# Patient Record
Sex: Female | Born: 1955 | Race: White | Hispanic: No | Smoking: Current every day smoker
Health system: Southern US, Community
[De-identification: ages and names within clinical notes are randomized; demographics above are authoritative.]

## PROBLEM LIST (undated history)

## (undated) DIAGNOSIS — I499 Cardiac arrhythmia, unspecified: Secondary | ICD-10-CM

## (undated) DIAGNOSIS — I8390 Asymptomatic varicose veins of unspecified lower extremity: Secondary | ICD-10-CM

## (undated) HISTORY — DX: Asymptomatic varicose veins of unspecified lower extremity: I83.90

## (undated) HISTORY — DX: Cardiac arrhythmia, unspecified: I49.9

## (undated) HISTORY — PX: TONSILECTOMY, ADENOIDECTOMY, BILATERAL MYRINGOTOMY AND TUBES: SHX2538

---

## 1999-04-29 ENCOUNTER — Other Ambulatory Visit: Admission: RE | Admit: 1999-04-29 | Discharge: 1999-04-29 | Payer: Self-pay | Admitting: *Deleted

## 2002-02-14 ENCOUNTER — Other Ambulatory Visit: Admission: RE | Admit: 2002-02-14 | Discharge: 2002-02-14 | Payer: Self-pay | Admitting: *Deleted

## 2002-03-23 ENCOUNTER — Encounter: Payer: Self-pay | Admitting: *Deleted

## 2002-03-23 ENCOUNTER — Encounter: Admission: RE | Admit: 2002-03-23 | Discharge: 2002-03-23 | Payer: Self-pay | Admitting: *Deleted

## 2003-04-04 ENCOUNTER — Other Ambulatory Visit: Admission: RE | Admit: 2003-04-04 | Discharge: 2003-04-04 | Payer: Self-pay | Admitting: Obstetrics and Gynecology

## 2004-09-03 ENCOUNTER — Other Ambulatory Visit: Admission: RE | Admit: 2004-09-03 | Discharge: 2004-09-03 | Payer: Self-pay | Admitting: *Deleted

## 2006-12-15 ENCOUNTER — Encounter: Admission: RE | Admit: 2006-12-15 | Discharge: 2006-12-15 | Payer: Self-pay | Admitting: Family Medicine

## 2006-12-29 ENCOUNTER — Encounter: Admission: RE | Admit: 2006-12-29 | Discharge: 2006-12-29 | Payer: Self-pay | Admitting: Family Medicine

## 2008-06-16 IMAGING — US US SOFT TISSUE HEAD/NECK
1 series · 14 of 25 positions shown · non-contrast
Comparison: none

CLINICAL DATA: Difficulty swallowing.  Lump in throat. 
 THYROID ULTRASOUND:
TECHNIQUE: Ultrasound examination of the thyroid gland and adjacent soft tissue structures was performed.

[Series 1: unknown · 0.08mm/px · 14 of 62 slices shown]
[im 1/62]
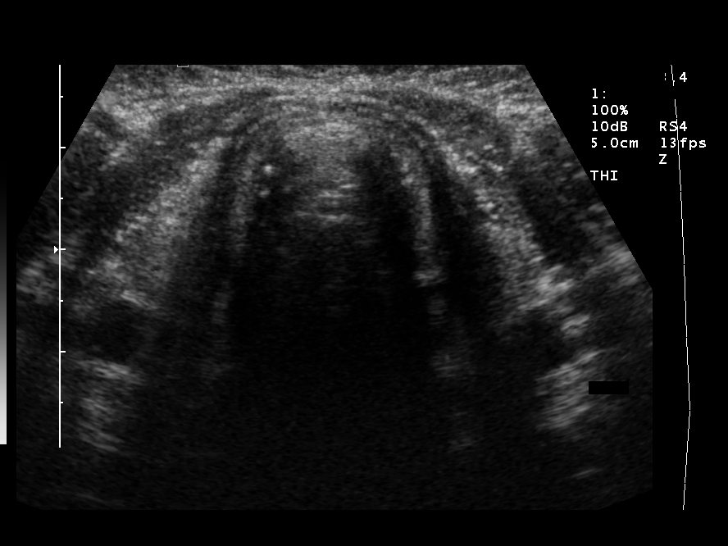
[im 6/62]
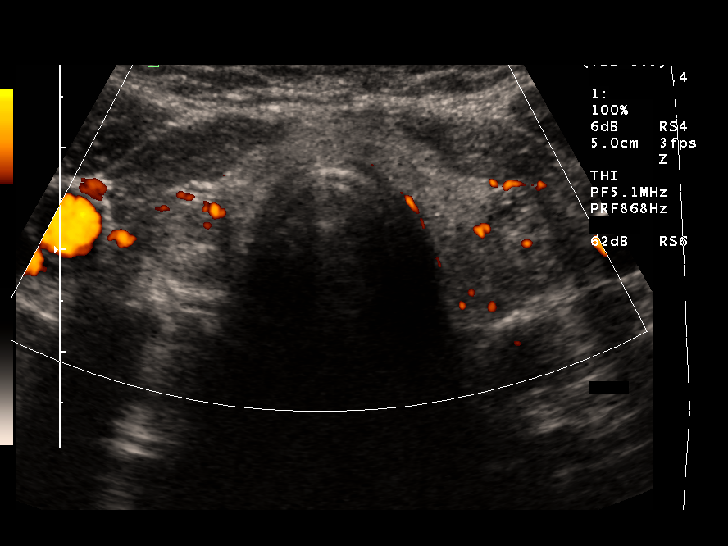
[im 11/62]
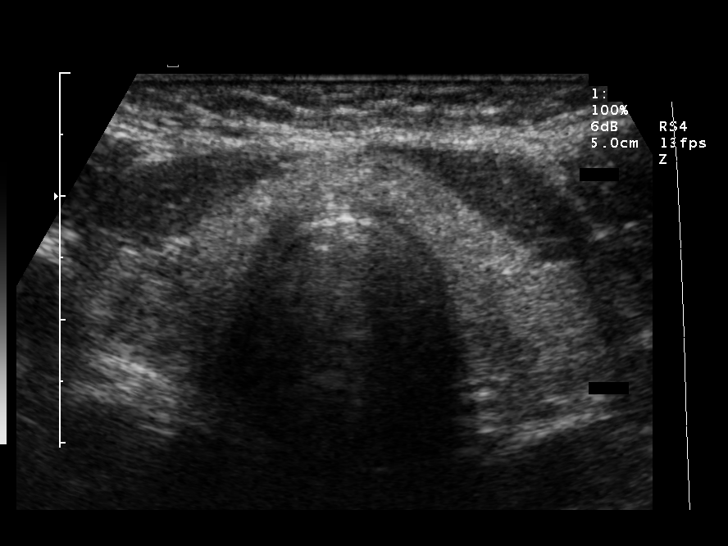
[im 16/62]
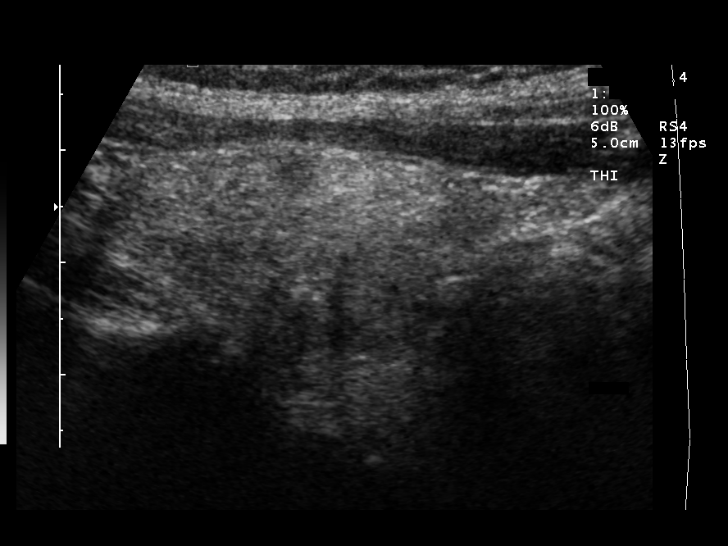
[im 21/62]
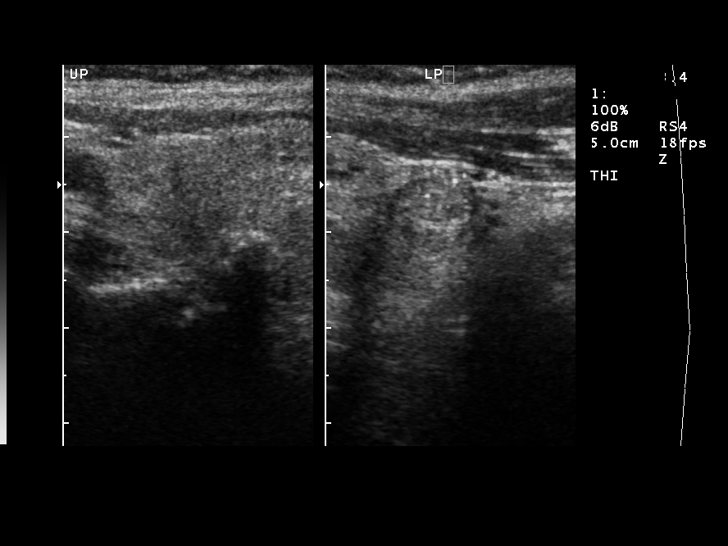
[im 23/62]
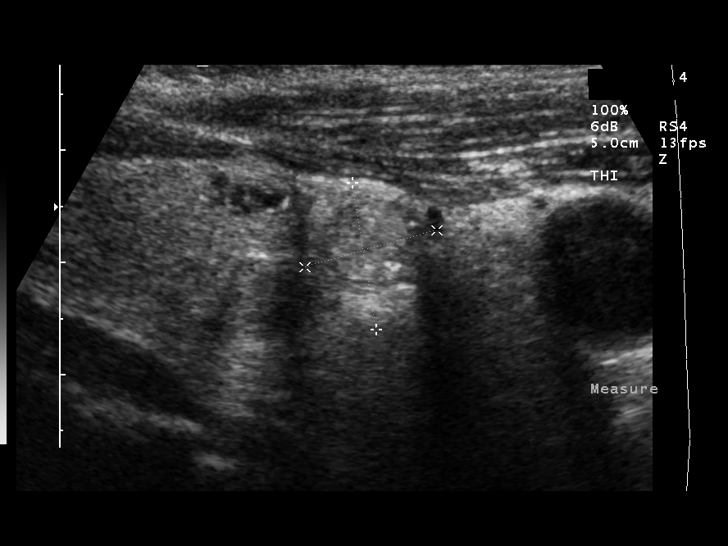
[im 28/62]
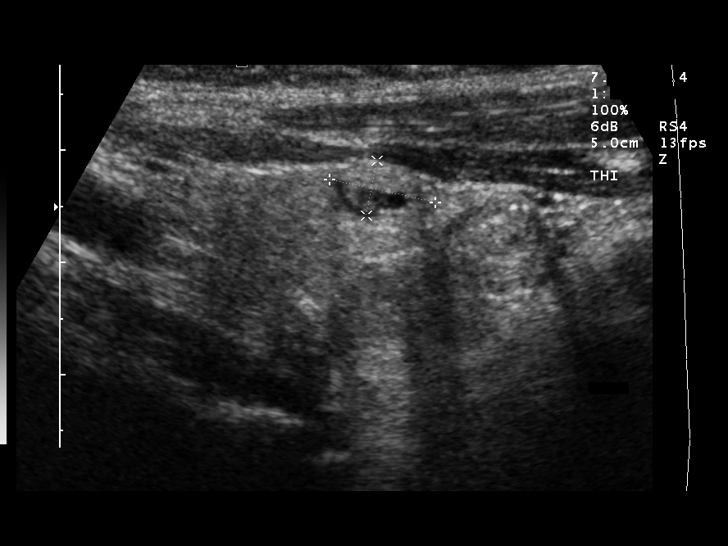
[im 34/62]
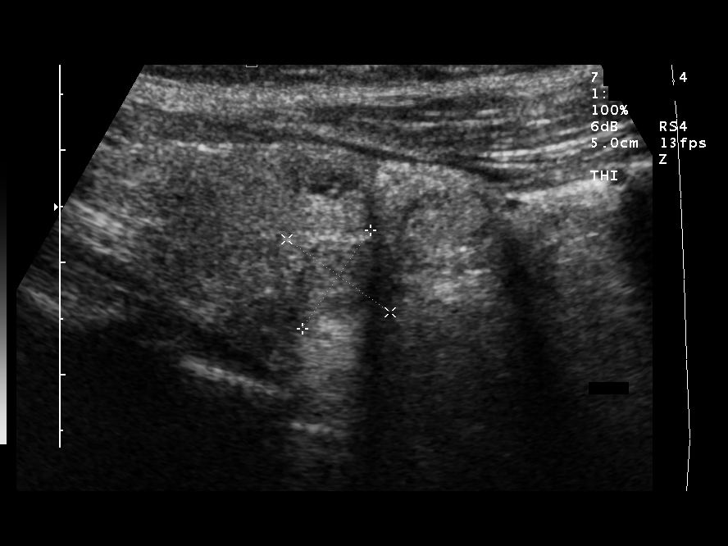
[im 39/62]
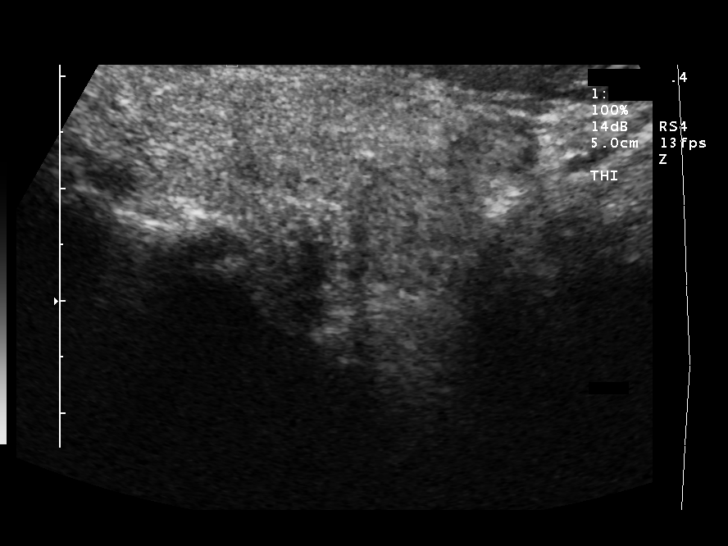
[im 41/62]
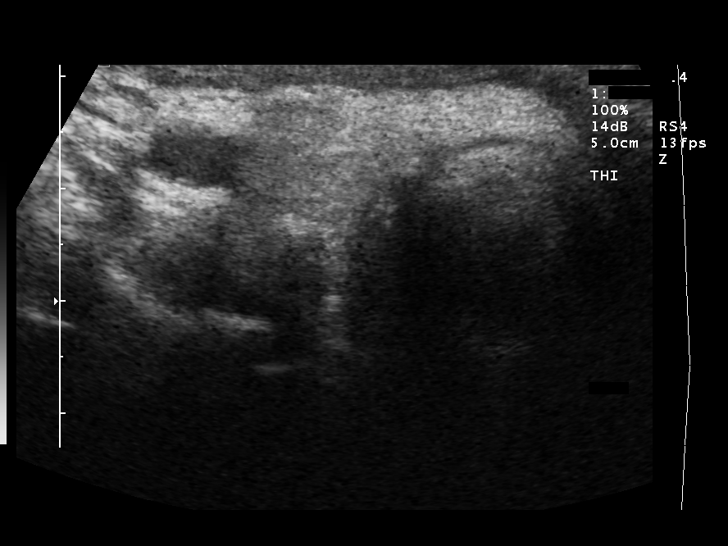
[im 46/62]
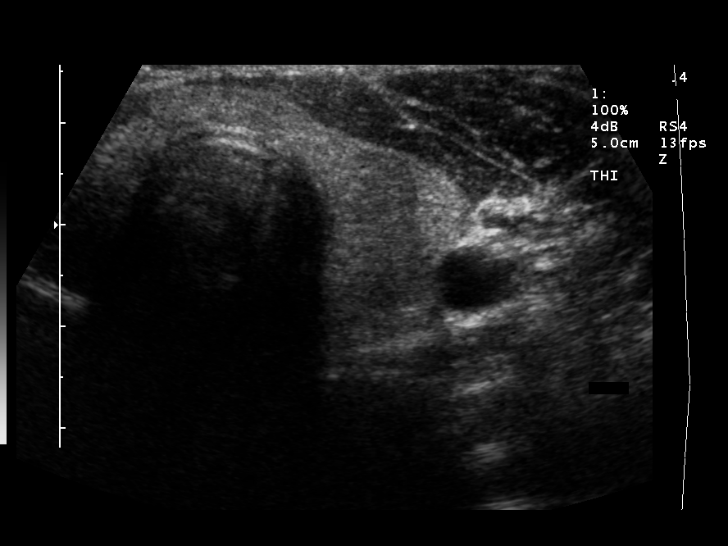
[im 51/62]
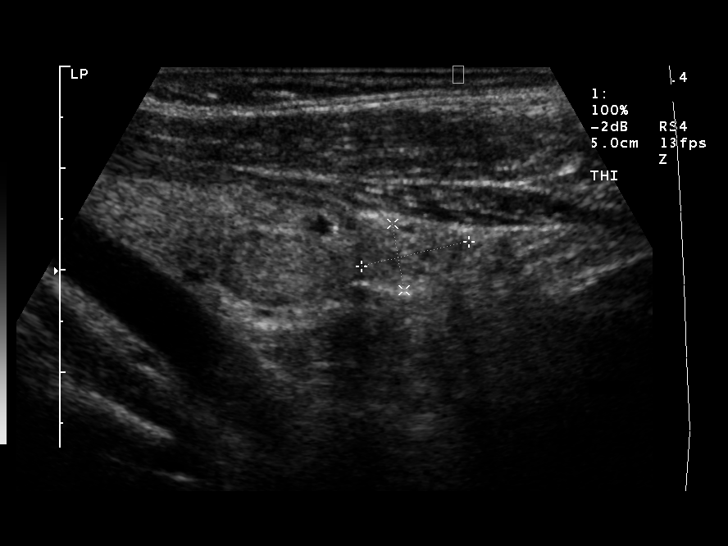
[im 56/62]
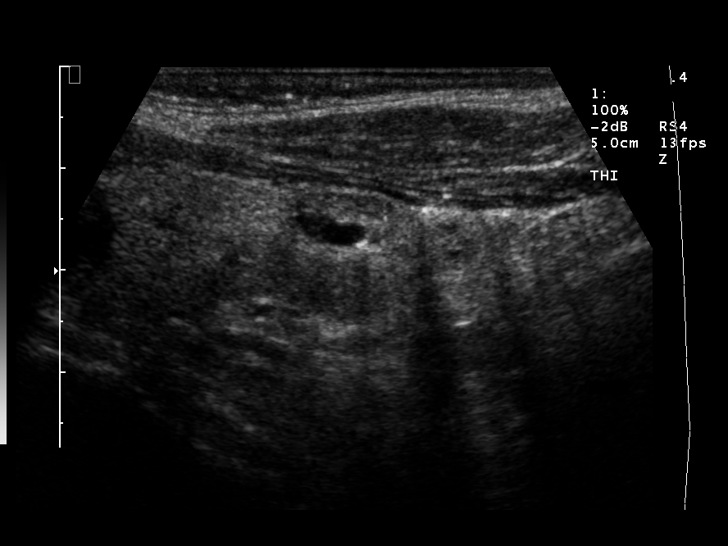
[im 62/62]
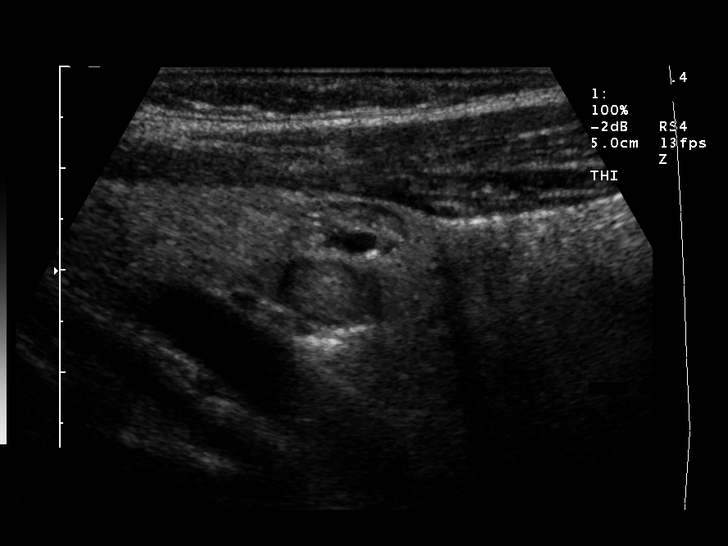

[14 of 25 positions shown; findings below may reference images not displayed]

FINDINGS: The thyroid gland is minimally prominent in size but very inhomogeneous.  The right lobe measures 4.8 cm sagittally with a depth of 2.3 cm and width of 1.6 cm.  The left lobe measures 4.7 x 1.5 x 1.5 cm with the isthmus measuring 5 mm.  There are several solid nodules bilaterally.  The largest is in the lower pole on the left of 1.6 x 1.3 x 1.4 cm.  Several other nodules are present primarily in the right lobe of 13 mm or less in size.
IMPRESSION: Multiple solid nodules the largest of 1.6 cm in the lower pole on the left.  The gland is within normal limits in size but inhomogeneous in echogenicity.

## 2012-12-09 ENCOUNTER — Encounter: Payer: Self-pay | Admitting: Vascular Surgery

## 2013-01-05 ENCOUNTER — Encounter: Payer: Self-pay | Admitting: Vascular Surgery

## 2013-01-06 ENCOUNTER — Encounter: Payer: Self-pay | Admitting: Vascular Surgery

## 2013-01-12 ENCOUNTER — Encounter: Payer: Self-pay | Admitting: Vascular Surgery

## 2013-01-13 ENCOUNTER — Ambulatory Visit (INDEPENDENT_AMBULATORY_CARE_PROVIDER_SITE_OTHER): Payer: BC Managed Care – PPO | Admitting: Vascular Surgery

## 2013-01-13 ENCOUNTER — Encounter: Payer: Self-pay | Admitting: Vascular Surgery

## 2013-01-13 ENCOUNTER — Encounter (INDEPENDENT_AMBULATORY_CARE_PROVIDER_SITE_OTHER): Payer: BC Managed Care – PPO | Admitting: *Deleted

## 2013-01-13 VITALS — BP 100/71 | HR 52 | Resp 16 | Ht 66.0 in | Wt 192.0 lb

## 2013-01-13 DIAGNOSIS — I83893 Varicose veins of bilateral lower extremities with other complications: Secondary | ICD-10-CM

## 2013-01-13 NOTE — Progress Notes (Signed)
[facility] [place] HISTORY AND PHYSICAL   History of Present Illness:  Patient is a 57 y.o. year old female who presents for evaluation of chronic leg swelling and varicose veins. She denies family history of hypercoagulable state. She denies prior history of DVT. She denies prior significant trauma to her lower extremities. She currently works as a Runner, broadcasting/film/video at [employer]. She is standing on her legs large amounts of time during the day. She does wear knee-high compression stockings. She states that her left leg swells more than the right but this becomes worse as the day progresses. She also describes heaviness aching and fullness in her legs. She also describes an itching sensation deep within her legs every evening. This is relieved primarily by elevation and rest. She does have family history of varicose veins. Other medical problems include prior history of irregular heartbeat but no recent symptoms.  Past Medical History  Diagnosis Date  . Irregular heart beat   . Varicose veins     Past Surgical History  Procedure Laterality Date  . Tonsilectomy, adenoidectomy, bilateral myringotomy and tubes       Social History History  Substance Use Topics  . Smoking status: Current Every Day Smoker -- 0.25 packs/day    Types: Cigarettes  . Smokeless tobacco: Never Used  . Alcohol Use: 0.5 oz/week    1 drink(s) per week    Family History Family History  Problem Relation Age of Onset  . Diabetes Mother   . Deep vein thrombosis Father   . Alzheimer's disease Father   . Hypertension Father   . Heart disease Father   . Heart attack Father     Allergies  Not on File   Current Outpatient Prescriptions  Medication Sig Dispense Refill  . amphetamine-dextroamphetamine (ADDERALL) 20 MG tablet Take 20 mg by mouth 2 (two) times daily.      Marland Kitchen co-enzyme Q-10 30 MG capsule Take 30 mg by mouth 3 (three) times daily.      . Multiple Vitamin  (MULTIVITAMIN) tablet Take 1 tablet by mouth daily.      . multivitamin-lutein (OCUVITE-LUTEIN) CAPS Take 1 capsule by mouth daily.       No current facility-administered medications for this visit.    ROS:   General:  No weight loss, Fever, chills  HEENT: No recent headaches, no nasal bleeding, no visual changes, no sore throat  Neurologic: No dizziness, blackouts, seizures. No recent symptoms of stroke or mini- stroke. No recent episodes of slurred speech, or temporary blindness.  Cardiac: No recent episodes of chest pain/pressure, no shortness of breath at rest.  No shortness of breath with exertion.    Vascular: No history of rest pain in feet.  No history of claudication.  No history of non-healing ulcer, No history of DVT   Pulmonary: No home oxygen, no productive cough, no hemoptysis,  No asthma or wheezing  Musculoskeletal:  [ ]  Arthritis, [ ]  Low back pain,  [x ] Joint pain  Hematologic:No history of hypercoagulable state.  No history of easy bleeding.  No history of anemia  Gastrointestinal: No hematochezia or melena,  No gastroesophageal reflux, no trouble swallowing  Urinary: [ ]  chronic Kidney disease, [ ]  on HD - [ ]  MWF or [ ]  TTHS, [ ]  Burning with urination, [ ]  Frequent urination, [ ]  Difficulty urinating;   Skin: No rashes  Psychological: No history of anxiety,  No history of depression   Physical Examination  Filed  Vitals:   01/13/13 1010  BP: 100/71  Pulse: 52  Resp: 16  Height: 5\' 6"  (1.676 m)  Weight: 192 lb (87.091 kg)  SpO2: 97%    Body mass index is 31 kg/(m^2).  General:  Alert and oriented, no acute distress HEENT: Normal Neck: No bruit or JVD Pulmonary: Clear to auscultation bilaterally Cardiac: Regular Rate and Rhythm without murmur Abdomen: Soft, non-tender, non-distended, no mass, slightly obese Skin: No rash, scattered 2-3 mm clusters of varicosities in the left posterior calf. Few blush spider-type varicosities in the anterior  thigh bilaterally. Extremity Pulses:  2+ radial, brachial, femoral, dorsalis pedis, posterior tibial pulses bilaterally Musculoskeletal: No deformity trace edema  Neurologic: Upper and lower extremity motor 5/5 and symmetric  DATA: Patient had a venous duplex exam today. This showed trivial reflux in the right and left greater saphenous veins mid thigh. She did however have evidence of deep vein incompetence in the common femoral vein bilaterally   ASSESSMENT: Bilateral deep venous incompetence with some contribution from the superficial system. However I do not believe the superficial venous incompetence is severe enough to consider ablation. I did discuss with her the pathophysiology of venous incompetence. She will continue to wear her compression stockings and replace these as needed. She is fairly compliant with her compression therapy. She also did talk to our pain clinic nurse today about some possible sclerotherapy of the spider-type varicosities and reticular is. She will call to schedule this in the future if she wishes to proceed.  Plan: See above  Fabienne Bruns, MD [facility] [place] Office: [phone] Pager: [phone]

## 2013-11-17 ENCOUNTER — Other Ambulatory Visit (HOSPITAL_COMMUNITY)
Admission: RE | Admit: 2013-11-17 | Discharge: 2013-11-17 | Disposition: A | Discharge status: Home / Self Care | Payer: BC Managed Care – PPO | Source: Ambulatory Visit | Attending: Family Medicine | Admitting: Family Medicine

## 2013-11-17 ENCOUNTER — Other Ambulatory Visit: Payer: Self-pay | Admitting: Family Medicine

## 2013-11-17 DIAGNOSIS — Z124 Encounter for screening for malignant neoplasm of cervix: Secondary | ICD-10-CM | POA: Insufficient documentation

## 2014-07-25 ENCOUNTER — Encounter: Payer: Self-pay | Admitting: *Deleted

## 2014-07-26 ENCOUNTER — Ambulatory Visit (INDEPENDENT_AMBULATORY_CARE_PROVIDER_SITE_OTHER): Payer: BC Managed Care – PPO | Admitting: *Deleted

## 2014-07-26 DIAGNOSIS — I780 Hereditary hemorrhagic telangiectasia: Secondary | ICD-10-CM

## 2014-07-26 DIAGNOSIS — I781 Nevus, non-neoplastic: Secondary | ICD-10-CM

## 2014-07-26 NOTE — Progress Notes (Signed)
.  3% Sotradecol administered with a 27g butterfly.  Patient received a total of 5cc.  Pt has some reflux so suggested just trying one syringe to get most noticeable spiders and retics. Hoping for good results. Will follow prn.  Photos: Yes.    Compression stockings applied: Yes.

## 2014-07-27 ENCOUNTER — Encounter: Payer: Self-pay | Admitting: Vascular Surgery

## 2019-06-23 ENCOUNTER — Other Ambulatory Visit (HOSPITAL_COMMUNITY)
Admission: RE | Admit: 2019-06-23 | Discharge: 2019-06-23 | Disposition: A | Discharge status: Home / Self Care | Payer: BC Managed Care – PPO | Source: Ambulatory Visit | Attending: Family Medicine | Admitting: Family Medicine

## 2019-06-23 ENCOUNTER — Other Ambulatory Visit: Payer: Self-pay | Admitting: Family Medicine

## 2019-06-23 DIAGNOSIS — Z124 Encounter for screening for malignant neoplasm of cervix: Secondary | ICD-10-CM | POA: Insufficient documentation

## 2019-06-27 LAB — CYTOLOGY - PAP
Diagnosis: NEGATIVE
HPV: NOT DETECTED

## 2019-06-28 ENCOUNTER — Other Ambulatory Visit: Payer: Self-pay | Admitting: Family Medicine

## 2019-06-28 DIAGNOSIS — E2839 Other primary ovarian failure: Secondary | ICD-10-CM

## 2019-06-28 DIAGNOSIS — Z1231 Encounter for screening mammogram for malignant neoplasm of breast: Secondary | ICD-10-CM

## 2019-07-12 ENCOUNTER — Other Ambulatory Visit: Payer: Self-pay

## 2019-07-12 DIAGNOSIS — Z20822 Contact with and (suspected) exposure to covid-19: Secondary | ICD-10-CM

## 2019-07-13 LAB — NOVEL CORONAVIRUS, NAA: SARS-CoV-2, NAA: NOT DETECTED

## 2019-11-05 ENCOUNTER — Ambulatory Visit: Payer: BC Managed Care – PPO | Attending: Internal Medicine

## 2019-11-05 DIAGNOSIS — Z23 Encounter for immunization: Secondary | ICD-10-CM

## 2019-11-05 NOTE — Progress Notes (Signed)
   Covid-19 Vaccination Clinic  Name:  Lauren Miller    MRN: [id number] DOB: 1956-03-31  11/05/2019  [name] was observed post Covid-19 immunization for 15 minutes without incidence. She was provided with Vaccine Information Sheet and instruction to access the V-Safe system.   [name] was instructed to call 911 with any severe reactions post vaccine: Marland Kitchen Difficulty breathing  . Swelling of your face and throat  . A fast heartbeat  . A bad rash all over your body  . Dizziness and weakness    Immunizations Administered    Name Date Dose VIS Date Route   Pfizer COVID-19 Vaccine 11/05/2019  3:00 PM 0.3 mL 09/23/2019 Intramuscular   Manufacturer: [employer], [employer]   Lot: [id number]   NDC: [address]-5

## 2019-11-27 ENCOUNTER — Ambulatory Visit: Payer: BC Managed Care – PPO | Attending: Internal Medicine

## 2019-11-27 DIAGNOSIS — Z23 Encounter for immunization: Secondary | ICD-10-CM | POA: Insufficient documentation

## 2019-11-27 NOTE — Progress Notes (Signed)
   Covid-19 Vaccination Clinic  Name:  Lauren Miller    MRN: [id number] DOB: [birth year]  11/27/2019  Lauren Miller was observed post Covid-19 immunization for 15 minutes without incidence. She was provided with Vaccine Information Sheet and instruction to access the V-Safe system.   Lauren Miller was instructed to call 911 with any severe reactions post vaccine: Marland Kitchen Difficulty breathing  . Swelling of your face and throat  . A fast heartbeat  . A bad rash all over your body  . Dizziness and weakness    Immunizations Administered    Name Date Dose VIS Date Route   Pfizer COVID-19 Vaccine 11/27/2019 12:23 PM 0.3 mL 09/23/2019 Intramuscular   Manufacturer: [employer], [employer]   Lot: [id number]   NDC: [address]-4

## 2020-01-02 ENCOUNTER — Other Ambulatory Visit (HOSPITAL_COMMUNITY): Payer: Self-pay | Admitting: Family Medicine

## 2020-02-23 ENCOUNTER — Other Ambulatory Visit: Payer: Self-pay | Admitting: Family Medicine

## 2020-02-23 DIAGNOSIS — Z1231 Encounter for screening mammogram for malignant neoplasm of breast: Secondary | ICD-10-CM

## 2020-03-01 ENCOUNTER — Ambulatory Visit
Admission: RE | Admit: 2020-03-01 | Discharge: 2020-03-01 | Disposition: A | Discharge status: Home / Self Care | Payer: BC Managed Care – PPO | Source: Ambulatory Visit

## 2020-03-01 ENCOUNTER — Other Ambulatory Visit: Payer: Self-pay

## 2020-03-01 DIAGNOSIS — Z1231 Encounter for screening mammogram for malignant neoplasm of breast: Secondary | ICD-10-CM

## 2020-10-16 MED FILL — ESTRADIOL 0.1 MG/GM CREA: 0.1 | 90 days supply | Qty: 43 | Fill #0

## 2021-01-01 ENCOUNTER — Other Ambulatory Visit (HOSPITAL_BASED_OUTPATIENT_CLINIC_OR_DEPARTMENT_OTHER): Payer: Self-pay

## 2021-09-01 IMAGING — MG DIGITAL SCREENING BILAT W/ TOMO W/ CAD
8 series · 8 of 24 positions shown · non-contrast
Comparison: None.

CLINICAL DATA: Screening.

EXAM:
DIGITAL SCREENING BILATERAL MAMMOGRAM WITH TOMO AND CAD

[R MLO synth-2D]
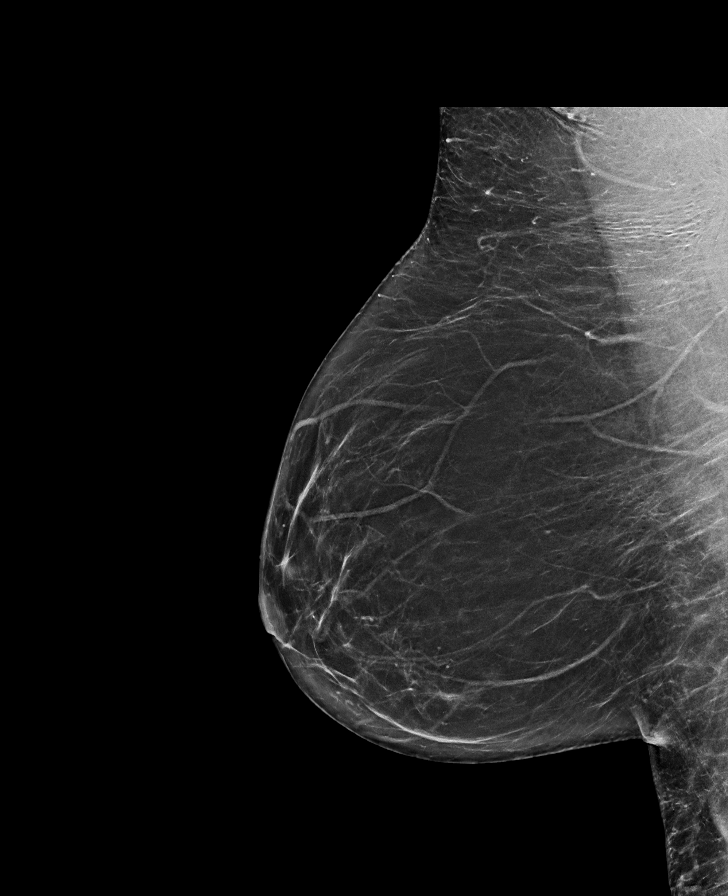

[R CC synth-2D]
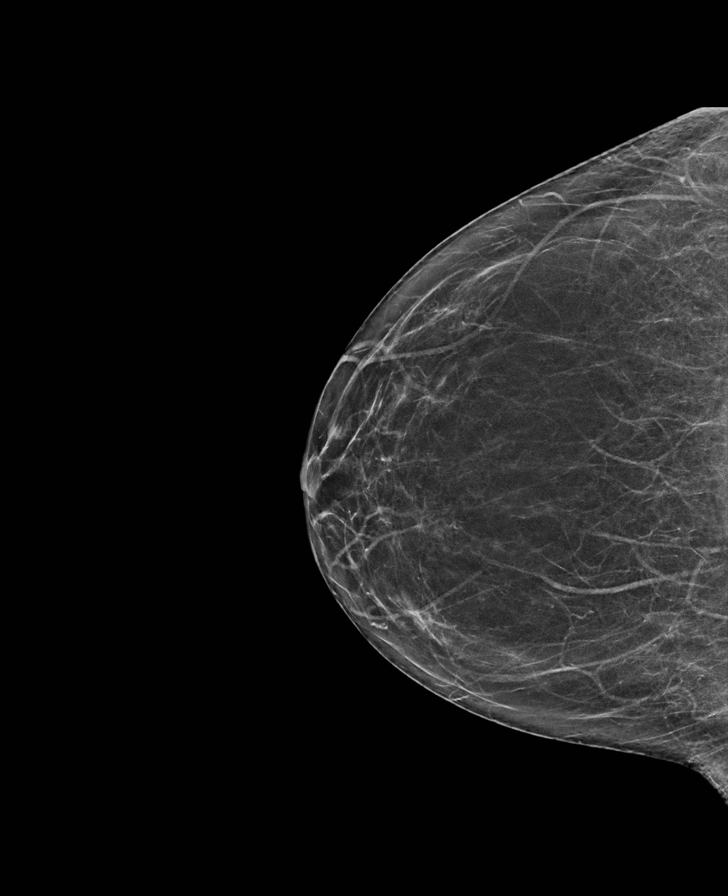

[L CC synth-2D]
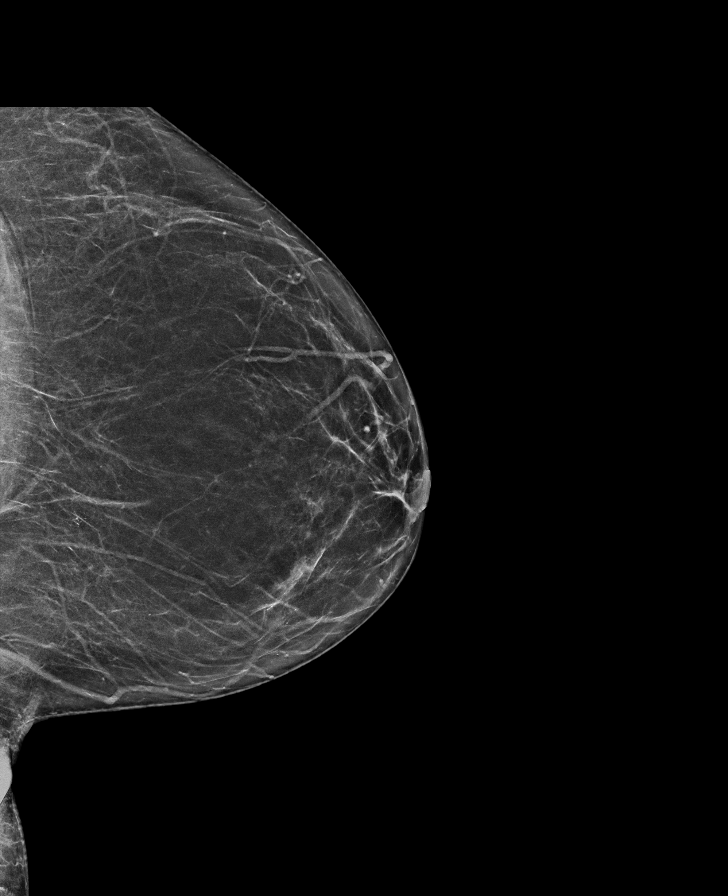

[L MLO synth-2D]
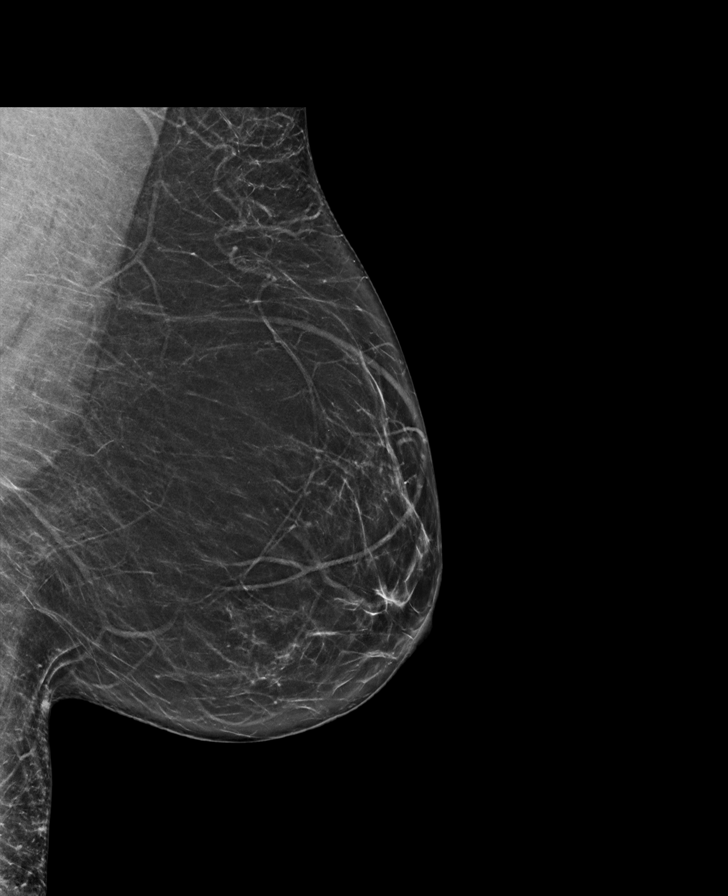

[L MLO tomo · tomo slice 35/69.0]
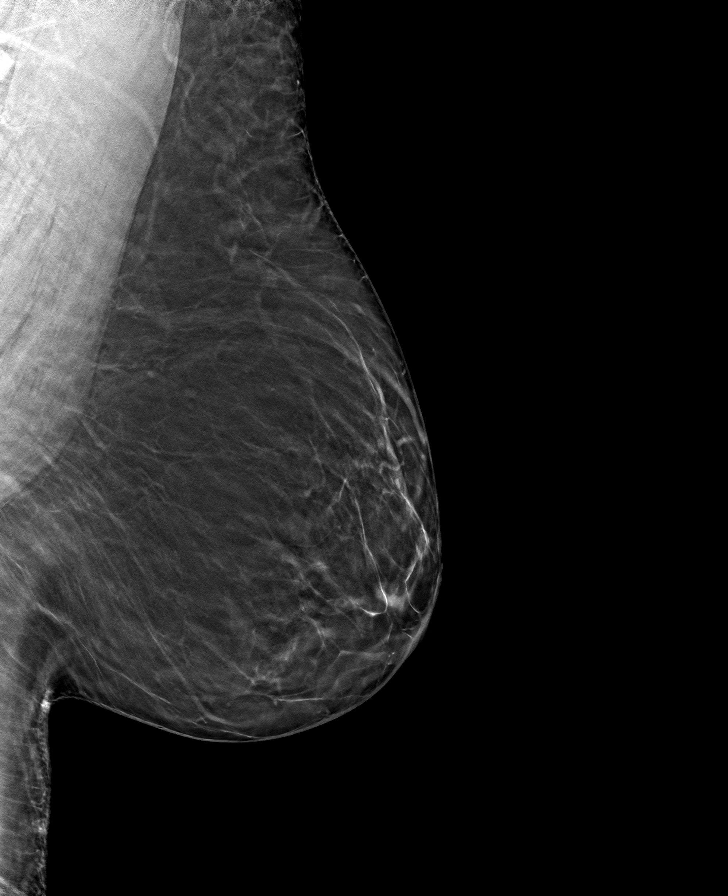

[R CC tomo · tomo slice 31/62.0]
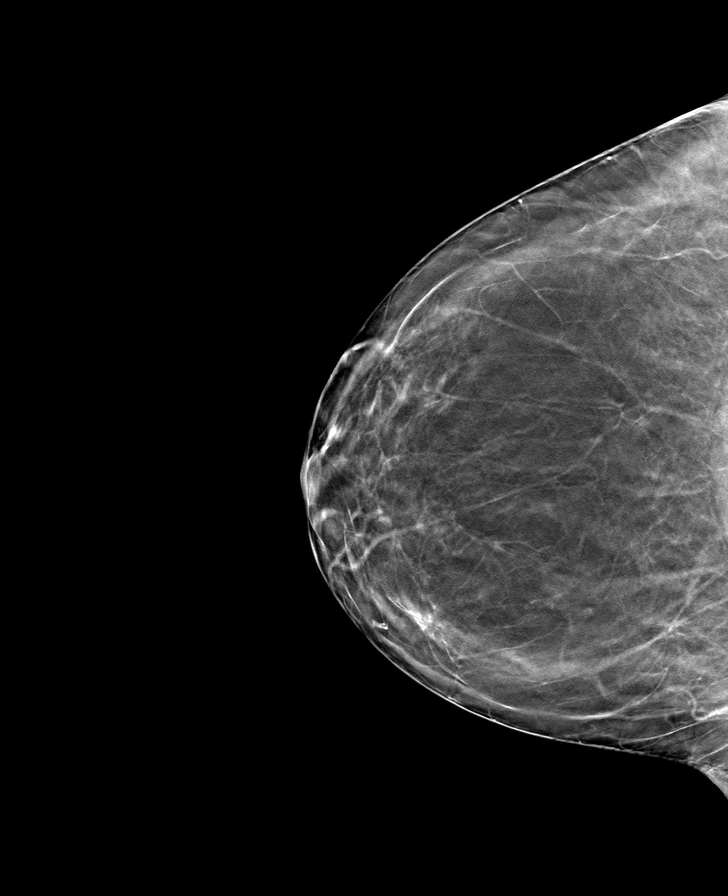

[L CC tomo · tomo slice 32/63.0]
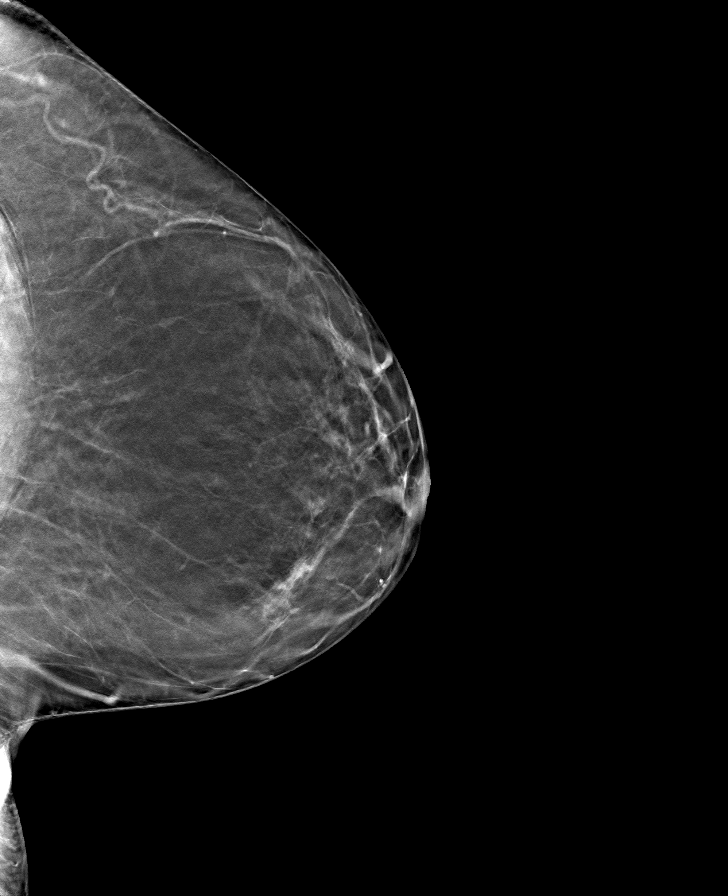

[R MLO tomo · tomo slice 37/72.0]
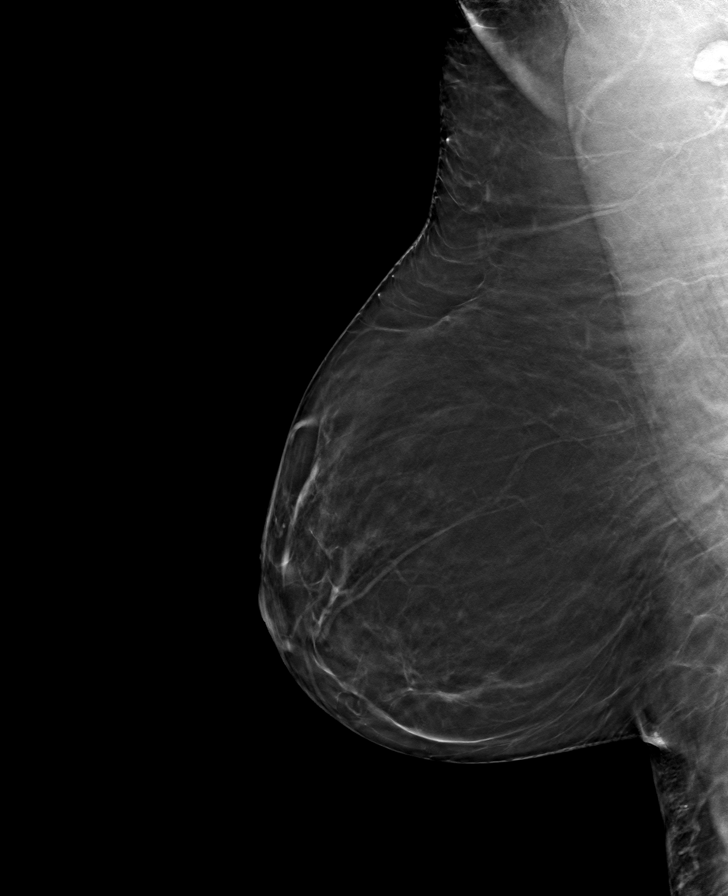

[8 of 24 positions shown; findings below may reference images not displayed]

ACR Breast Density Category b: There are scattered areas of
fibroglandular density.
FINDINGS: There are no findings suspicious for malignancy. Images were
processed with CAD.
IMPRESSION: No mammographic evidence of malignancy. A result letter of this
screening mammogram will be mailed directly to the patient.

RECOMMENDATION:
Screening mammogram in one year. (Code:Y5-G-EJ6)

BI-RADS CATEGORY  1: Negative.

## 2022-01-06 DIAGNOSIS — F900 Attention-deficit hyperactivity disorder, predominantly inattentive type: Secondary | ICD-10-CM | POA: Diagnosis not present

## 2022-05-31 ENCOUNTER — Ambulatory Visit
Admission: EM | Admit: 2022-05-31 | Discharge: 2022-05-31 | Disposition: A | Discharge status: Home / Self Care | Payer: Medicare PPO

## 2022-05-31 DIAGNOSIS — H60502 Unspecified acute noninfective otitis externa, left ear: Secondary | ICD-10-CM

## 2022-05-31 MED ORDER — CIPROFLOXACIN-DEXAMETHASONE 0.3-0.1 % OT SUSP: 4.0000 [drp] | Freq: Two times a day (BID) | OTIC | 0 refills | Status: AC

## 2022-05-31 NOTE — Discharge Instructions (Signed)
Take medication as prescribed. May take Tylenol or Ibuprofen pain, fever, or general discomfort. Warm compresses to the affected ear help with comfort. Do not stick anything inside the ear while symptoms persist. Avoid getting water inside of the ear while symptoms persist. Follow-up if symptoms do not improve.

## 2022-05-31 NOTE — ED Provider Notes (Signed)
[facility]    CSN: [id number] Arrival date & time: 05/31/22  0911      History   Chief Complaint Chief Complaint  Patient presents with   Otalgia    HPI Lauren Miller is a 66 y.o. female.   The history is provided by the patient.   Patient presents for complaints of left ear pain that been present for the past 4 days.  Patient states ear started feeling itchy, now she has decreased hearing and increased pain.  Patient states that she has been swimming frequently over the past several weeks.  She denies fever, chills, or upper respiratory symptoms.  Patient states she has been using alcohol in the left ear with minimal relief.  Past Medical History:  Diagnosis Date   Irregular heart beat    Varicose veins     Patient Active Problem List   Diagnosis Date Noted   Spider veins 07/26/2014   Varicose veins of lower extremities with other complications 01/13/2013    Past Surgical History:  Procedure Laterality Date   TONSILECTOMY, ADENOIDECTOMY, BILATERAL MYRINGOTOMY AND TUBES      OB History   No obstetric history on file.      Home Medications    Prior to Admission medications   Medication Sig Start Date End Date Taking? Authorizing Provider  ciprofloxacin-dexamethasone (CIPRODEX) OTIC suspension Place 4 drops into the left ear 2 (two) times daily for 7 days. 05/31/22 06/07/22 Yes Ludene Stokke-Warren, Sadie Haber, NP  Creatinine POWD by Does not apply route.   Yes [provider]  Emollient (COLLAGEN EX) Apply topically.   Yes [provider]  naproxen sodium (ALEVE) 220 MG tablet Take 220 mg by mouth.   Yes [provider]  amphetamine-dextroamphetamine (ADDERALL) 20 MG tablet Take 20 mg by mouth 2 (two) times daily.    [provider]  co-enzyme Q-10 30 MG capsule Take 30 mg by mouth 3 (three) times daily.    [provider]  estradiol (ESTRACE) 0.1 MG/GM vaginal cream Place 1 g vaginally 2 (two) times a week.  05/22/22   [provider]  Multiple Vitamin (MULTIVITAMIN) tablet Take 1 tablet by mouth daily.    [provider]  multivitamin-lutein (OCUVITE-LUTEIN) CAPS Take 1 capsule by mouth daily.    [provider]    Family History Family History  Problem Relation Age of Onset   Diabetes Mother    Deep vein thrombosis Father    Alzheimer's disease Father    Hypertension Father    Heart disease Father    Heart attack Father     Social History Social History   Tobacco Use   Smoking status: Every Day    Packs/day: 0.25    Types: Cigarettes   Smokeless tobacco: Never  Substance Use Topics   Alcohol use: Yes    Alcohol/week: 1.0 standard drink of alcohol    Types: 1 drink(s) per week   Drug use: Yes    Types: Marijuana     Allergies   Patient has no known allergies.   Review of Systems Review of Systems Per HPI  Physical Exam Triage Vital Signs ED Triage Vitals  Enc Vitals Group     BP 05/31/22 0918 123/79     Pulse Rate 05/31/22 0918 60     Resp 05/31/22 0918 16     Temp 05/31/22 0918 97.8 F (36.6 C)     Temp Source 05/31/22 0918 Oral     SpO2 05/31/22 0918 95 %  Weight --      Height --      Head Circumference --      Peak Flow --      Pain Score 05/31/22 0919 4     Pain Loc --      Pain Edu? --      Excl. in GC? --    No data found.  Updated Vital Signs BP 123/79 (BP Location: Right Arm)   Pulse 60   Temp 97.8 F (36.6 C) (Oral)   Resp 16   SpO2 95%   Visual Acuity Right Eye Distance:   Left Eye Distance:   Bilateral Distance:    Right Eye Near:   Left Eye Near:    Bilateral Near:     Physical Exam Vitals and nursing note reviewed.  Constitutional:      General: She is not in acute distress.    Appearance: Normal appearance.  HENT:     Head: Normocephalic.     Right Ear: Tympanic membrane, ear canal and external ear normal.     Left Ear: Tympanic membrane normal.     Ears:     Comments: Tragus and pinna  tender to palpation. Left ear canal is erythematous.    Nose: Nose normal.     Mouth/Throat:     Mouth: Mucous membranes are moist.  Eyes:     Extraocular Movements: Extraocular movements intact.     Pupils: Pupils are equal, round, and reactive to light.  Cardiovascular:     Rate and Rhythm: Normal rate and regular rhythm.     Pulses: Normal pulses.     Heart sounds: Normal heart sounds.  Pulmonary:     Effort: Pulmonary effort is normal.     Breath sounds: Normal breath sounds.  Abdominal:     General: Bowel sounds are normal.     Palpations: Abdomen is soft.  Musculoskeletal:     Cervical back: Normal range of motion.  Lymphadenopathy:     Cervical: No cervical adenopathy.  Skin:    General: Skin is warm and dry.  Neurological:     General: No focal deficit present.     Mental Status: She is alert and oriented to person, place, and time.  Psychiatric:        Mood and Affect: Mood normal.      UC Treatments / Results  Labs (all labs ordered are listed, but only abnormal results are displayed) Labs Reviewed - No data to display  EKG   Radiology No results found.  Procedures Procedures (including critical care time)  Medications Ordered in UC Medications - No data to display  Initial Impression / Assessment and Plan / UC Course  I have reviewed the triage vital signs and the nursing notes.  Pertinent labs & imaging results that were available during my care of the patient were reviewed by me and considered in my medical decision making (see chart for details).  Patient presents for complaints of left ear symptoms that been present for the past 4 days.  On exam, patient has tenderness with manipulation to the tragus and pinna of the left ear.  Ear canal is erythematous and swollen.  Patient reports that she has been swimming frequently over the past several weeks.  Symptoms are consistent with left otitis externa.  Supportive care recommendations were provided to  the patient.  Patient encouraged to follow-up in this clinic or with her primary care physician if symptoms fail to improve. Final Clinical Impressions(s) /  UC Diagnoses   Final diagnoses:  Acute otitis externa of left ear, unspecified type     Discharge Instructions      Take medication as prescribed. May take Tylenol or Ibuprofen pain, fever, or general discomfort. Warm compresses to the affected ear help with comfort. Do not stick anything inside the ear while symptoms persist. Avoid getting water inside of the ear while symptoms persist. Follow-up if symptoms do not improve.      ED Prescriptions     Medication Sig Dispense Auth. Provider   ciprofloxacin-dexamethasone (CIPRODEX) OTIC suspension Place 4 drops into the left ear 2 (two) times daily for 7 days. 7.5 mL Tiago Humphrey-Warren, Sadie Haber, NP      PDMP not reviewed this encounter.   Abran Cantor, NP 05/31/22 [phone]

## 2022-05-31 NOTE — ED Triage Notes (Signed)
Pt reports left ear pain, itching and hearing decrease x 4 days.

## 2022-06-20 DIAGNOSIS — H35363 Drusen (degenerative) of macula, bilateral: Secondary | ICD-10-CM | POA: Diagnosis not present

## 2022-06-20 DIAGNOSIS — H02885 Meibomian gland dysfunction left lower eyelid: Secondary | ICD-10-CM | POA: Diagnosis not present

## 2022-06-20 DIAGNOSIS — H04121 Dry eye syndrome of right lacrimal gland: Secondary | ICD-10-CM | POA: Diagnosis not present

## 2022-06-20 DIAGNOSIS — H02882 Meibomian gland dysfunction right lower eyelid: Secondary | ICD-10-CM | POA: Diagnosis not present

## 2022-06-23 DIAGNOSIS — H04123 Dry eye syndrome of bilateral lacrimal glands: Secondary | ICD-10-CM | POA: Diagnosis not present

## 2022-06-26 ENCOUNTER — Other Ambulatory Visit: Payer: Self-pay | Admitting: Family Medicine

## 2022-06-26 ENCOUNTER — Ambulatory Visit
Admission: RE | Admit: 2022-06-26 | Discharge: 2022-06-26 | Disposition: A | Discharge status: Home / Self Care | Payer: Medicare PPO | Source: Ambulatory Visit | Attending: Family Medicine | Admitting: Family Medicine

## 2022-06-26 DIAGNOSIS — M25571 Pain in right ankle and joints of right foot: Secondary | ICD-10-CM

## 2022-06-26 DIAGNOSIS — Z13820 Encounter for screening for osteoporosis: Secondary | ICD-10-CM | POA: Diagnosis not present

## 2022-06-26 DIAGNOSIS — Z1159 Encounter for screening for other viral diseases: Secondary | ICD-10-CM | POA: Diagnosis not present

## 2022-06-26 DIAGNOSIS — E7800 Pure hypercholesterolemia, unspecified: Secondary | ICD-10-CM | POA: Diagnosis not present

## 2022-06-26 DIAGNOSIS — Z23 Encounter for immunization: Secondary | ICD-10-CM | POA: Diagnosis not present

## 2022-06-26 DIAGNOSIS — N760 Acute vaginitis: Secondary | ICD-10-CM | POA: Diagnosis not present

## 2022-06-26 DIAGNOSIS — M7989 Other specified soft tissue disorders: Secondary | ICD-10-CM | POA: Diagnosis not present

## 2022-06-26 DIAGNOSIS — M19071 Primary osteoarthritis, right ankle and foot: Secondary | ICD-10-CM | POA: Diagnosis not present

## 2022-06-26 DIAGNOSIS — F900 Attention-deficit hyperactivity disorder, predominantly inattentive type: Secondary | ICD-10-CM | POA: Diagnosis not present

## 2022-06-26 DIAGNOSIS — Z1239 Encounter for other screening for malignant neoplasm of breast: Secondary | ICD-10-CM | POA: Diagnosis not present

## 2022-06-26 DIAGNOSIS — Z1231 Encounter for screening mammogram for malignant neoplasm of breast: Secondary | ICD-10-CM

## 2022-06-26 DIAGNOSIS — Z0000 Encounter for general adult medical examination without abnormal findings: Secondary | ICD-10-CM | POA: Diagnosis not present

## 2022-07-15 DIAGNOSIS — Z1211 Encounter for screening for malignant neoplasm of colon: Secondary | ICD-10-CM | POA: Diagnosis not present

## 2022-07-21 LAB — COLOGUARD: COLOGUARD: NEGATIVE

## 2022-08-19 ENCOUNTER — Ambulatory Visit
Admission: EM | Admit: 2022-08-19 | Discharge: 2022-08-19 | Disposition: A | Discharge status: Home / Self Care | Payer: Medicare PPO | Attending: Nurse Practitioner | Admitting: Nurse Practitioner

## 2022-08-19 ENCOUNTER — Encounter: Payer: Self-pay | Admitting: Emergency Medicine

## 2022-08-19 DIAGNOSIS — R300 Dysuria: Secondary | ICD-10-CM | POA: Diagnosis not present

## 2022-08-19 DIAGNOSIS — Z113 Encounter for screening for infections with a predominantly sexual mode of transmission: Secondary | ICD-10-CM | POA: Diagnosis not present

## 2022-08-19 DIAGNOSIS — Z114 Encounter for screening for human immunodeficiency virus [HIV]: Secondary | ICD-10-CM | POA: Insufficient documentation

## 2022-08-19 DIAGNOSIS — N760 Acute vaginitis: Secondary | ICD-10-CM | POA: Diagnosis not present

## 2022-08-19 LAB — POCT URINALYSIS DIP (MANUAL ENTRY)
Bilirubin, UA: NEGATIVE
Blood, UA: NEGATIVE
Glucose, UA: NEGATIVE mg/dL
Ketones, POC UA: NEGATIVE mg/dL
Nitrite, UA: NEGATIVE
Protein Ur, POC: NEGATIVE mg/dL
Spec Grav, UA: 1.02 (ref 1.010–1.025)
Urobilinogen, UA: 0.2 E.U./dL
pH, UA: 5.5 (ref 5.0–8.0)

## 2022-08-19 MED ORDER — NYSTATIN 100000 UNIT/GM EX POWD: 1.0000 | Freq: Three times a day (TID) | CUTANEOUS | 0 refills | Status: DC

## 2022-08-19 MED ORDER — FLUCONAZOLE 150 MG PO TABS: 150.0000 mg | ORAL_TABLET | Freq: Once | ORAL | 0 refills | Status: AC

## 2022-08-19 NOTE — ED Provider Notes (Signed)
[facility]    CSN: [id number] Arrival date & time: 08/19/22  1000      History   Chief Complaint No chief complaint on file.   HPI Lauren Miller is a 66 y.o. female.   Patient presents for 2 weeks of vaginal irritation, itching on the inside and outside.  She thinks that she has a yeast infection and has been self treating with Monistat, miconazole cream.  Reports are now sores around her anal area and there is open skin in between her anus and vagina that is very painful.  She denies any vaginal discharge.  Reports it does hurt to urinate, burns on the outside of her skin.  Also endorses some recent change in the odor of her urine.  No new urinary frequency, urgency, or abdominal pain.  No hematuria, new back pain, suprapubic pain or pressure, flank pain, fever, nausea/vomiting.  No swelling in her groin.  Denies recent known exposures to STI, however she is sexually active with 1 partner and has been for the past 7 years.  No history of HSV.    Past Medical History:  Diagnosis Date   Irregular heart beat    Varicose veins     Patient Active Problem List   Diagnosis Date Noted   Spider veins 07/26/2014   Varicose veins of lower extremities with other complications 16/10/930    Past Surgical History:  Procedure Laterality Date   TONSILECTOMY, ADENOIDECTOMY, BILATERAL MYRINGOTOMY AND TUBES      OB History   No obstetric history on file.      Home Medications    Prior to Admission medications   Medication Sig Start Date End Date Taking? Authorizing Provider  fluconazole (DIFLUCAN) 150 MG tablet Take 1 tablet (150 mg total) by mouth once for 1 dose. 08/19/22 08/19/22 Yes Noemi Chapel A, NP  nystatin (MYCOSTATIN/NYSTOP) powder Apply 1 Application topically 3 (three) times daily. 08/19/22  Yes Noemi Chapel A, NP  amphetamine-dextroamphetamine (ADDERALL) 20 MG tablet Take 20 mg by mouth 2 (two) times daily.    [provider]  co-enzyme  Q-10 30 MG capsule Take 30 mg by mouth 3 (three) times daily.    [provider]  Creatinine POWD by Does not apply route.    [provider]  Emollient (COLLAGEN EX) Apply topically.    [provider]  estradiol (ESTRACE) 0.1 MG/GM vaginal cream Place 1 g vaginally 2 (two) times a week. 05/22/22   [provider]  Multiple Vitamin (MULTIVITAMIN) tablet Take 1 tablet by mouth daily.    [provider]  multivitamin-lutein (OCUVITE-LUTEIN) CAPS Take 1 capsule by mouth daily.    [provider]  naproxen sodium (ALEVE) 220 MG tablet Take 220 mg by mouth.    [provider]    Family History Family History  Problem Relation Age of Onset   Diabetes Mother    Deep vein thrombosis Father    Alzheimer's disease Father    Hypertension Father    Heart disease Father    Heart attack Father     Social History Social History   Tobacco Use   Smoking status: Every Day    Packs/day: 0.25    Types: Cigarettes   Smokeless tobacco: Never  Substance Use Topics   Alcohol use: Yes    Alcohol/week: 1.0 standard drink of alcohol    Types: 1 drink(s) per week   Drug use: Yes    Types: Marijuana     Allergies  Patient has no known allergies.   Review of Systems Review of Systems Per HPI  Physical Exam Triage Vital Signs ED Triage Vitals  Enc Vitals Group     BP 08/19/22 1037 127/84     Pulse Rate 08/19/22 1037 (!) 51     Resp 08/19/22 1037 18     Temp 08/19/22 1037 97.8 F (36.6 C)     Temp Source 08/19/22 1037 Oral     SpO2 08/19/22 1037 96 %     Weight --      Height --      Head Circumference --      Peak Flow --      Pain Score 08/19/22 1039 5     Pain Loc --      Pain Edu? --      Excl. in GC? --    No data found.  Updated Vital Signs BP 127/84 (BP Location: Right Arm)   Pulse (!) 51   Temp 97.8 F (36.6 C) (Oral)   Resp 18   SpO2 96%   Visual Acuity Right Eye Distance:   Left Eye Distance:    Bilateral Distance:    Right Eye Near:   Left Eye Near:    Bilateral Near:     Physical Exam Vitals and nursing note reviewed. Exam conducted with a chaperone present Clint Bolder, RN).  Constitutional:      General: She is not in acute distress.    Appearance: She is not toxic-appearing.  HENT:     Mouth/Throat:     Mouth: Mucous membranes are moist.     Pharynx: Oropharynx is clear.  Abdominal:     General: Abdomen is flat. Bowel sounds are normal. There is no distension.     Palpations: Abdomen is soft. There is no mass.     Tenderness: There is no abdominal tenderness. There is no right CVA tenderness, left CVA tenderness or guarding.  Genitourinary:    Exam position: Lithotomy position.     Pubic Area: No rash.      Labia:        Left: Tenderness and lesion present.      Vagina: Vaginal discharge (thick, white, clumpy) present. No tenderness or bleeding.       Comments: Erythematous papular lesions noted to genitalia in approximately areas marked; no fluctuance, active drainage. Lesions are sensitive to touch. There is clumpy white discharge on external genitalia.  No odor.   Lymphadenopathy:     Lower Body: No right inguinal adenopathy. No left inguinal adenopathy.  Skin:    General: Skin is warm and dry.     Coloration: Skin is not jaundiced or pale.     Findings: No erythema.  Neurological:     Mental Status: She is alert and oriented to person, place, and time.     Motor: No weakness.     Gait: Gait normal.  Psychiatric:        Behavior: Behavior is cooperative.      UC Treatments / Results  Labs (all labs ordered are listed, but only abnormal results are displayed) Labs Reviewed  POCT URINALYSIS DIP (MANUAL ENTRY) - Abnormal; Notable for the following components:      Result Value   Leukocytes, UA Trace (*)    All other components within normal limits  HSV CULTURE AND TYPING  URINE CULTURE  HIV ANTIBODY (ROUTINE TESTING W REFLEX)  RPR   CERVICOVAGINAL ANCILLARY ONLY    EKG   Radiology  No results found.  Procedures Procedures (including critical care time)  Medications Ordered in UC Medications - No data to display  Initial Impression / Assessment and Plan / UC Course  I have reviewed the triage vital signs and the nursing notes.  Pertinent labs & imaging results that were available during my care of the patient were reviewed by me and considered in my medical decision making (see chart for details).   Patient is well-appearing, normotensive, afebrile, not tachycardic, not tachypneic, oxygenating well on room air.  Patient is bradycardic at baseline  Vaginitis and vulvovaginitis Routine screening for STI (sexually transmitted infection) Encounter for screening for HIV Symptoms and examination consistent with yeast vaginitis, will treat with Diflucan 150 mg once and topical nystatin powder Vaginal swab obtained to check for yeast vaginitis, BV, gonorrhea, chlamydia, trichomonas HIV and syphilis testing also obtained given recent sexual intercourse HSV testing obtained of vaginal lesions  Dysuria Urinalysis today shows trace leukocytes Low suspicion for UTI; suspect dysuria is secondary to external vaginitis Urine culture sent for confirmation  The patient was given the opportunity to ask questions.  All questions answered to their satisfaction.  The patient is in agreement to this plan.  Final Clinical Impressions(s) / UC Diagnoses   Final diagnoses:  Vaginitis and vulvovaginitis  Routine screening for STI (sexually transmitted infection)  Encounter for screening for HIV  Dysuria     Discharge Instructions      Urine sample today shows a very small amount of white blood cells.  I am sending for a urine culture to check for a UTI.  Please increase intake of water in the meantime.   We have tested you for a yeast infection, bacterial vaginosis, gonorrhea, chlamydia, trichomonas, herpes virus,  syphilis, and HIV today.  We will call you with any positive test results.  In the meantime, please take the fluconazole pill to help with the itching and irritation around your vagina.   ED Prescriptions     Medication Sig Dispense Auth. Provider   fluconazole (DIFLUCAN) 150 MG tablet Take 1 tablet (150 mg total) by mouth once for 1 dose. 1 tablet Cathlean Marseilles A, NP   nystatin (MYCOSTATIN/NYSTOP) powder Apply 1 Application topically 3 (three) times daily. 15 g Valentino Nose, NP      PDMP not reviewed this encounter.   Valentino Nose, NP 08/19/22 [phone]

## 2022-08-19 NOTE — ED Triage Notes (Signed)
Vaginal area irritation x 2 weeks.  Denies any discharge.  Has tried monistat with some relief.

## 2022-08-19 NOTE — Discharge Instructions (Addendum)
Urine sample today shows a very small amount of white blood cells.  I am sending for a urine culture to check for a UTI.  Please increase intake of water in the meantime.   We have tested you for a yeast infection, bacterial vaginosis, gonorrhea, chlamydia, trichomonas, herpes virus, syphilis, and HIV today.  We will call you with any positive test results.  In the meantime, please take the fluconazole pill to help with the itching and irritation around your vagina.

## 2022-08-20 LAB — CERVICOVAGINAL ANCILLARY ONLY
Bacterial Vaginitis (gardnerella): POSITIVE — AB
Candida Glabrata: NEGATIVE
Candida Vaginitis: NEGATIVE
Chlamydia: NEGATIVE
Comment: NEGATIVE
Comment: NEGATIVE
Comment: NEGATIVE
Comment: NEGATIVE
Comment: NEGATIVE
Comment: NORMAL
Neisseria Gonorrhea: NEGATIVE
Trichomonas: NEGATIVE

## 2022-08-20 LAB — URINE CULTURE: Culture: <10000 — AB

## 2022-08-20 LAB — HIV ANTIBODY (ROUTINE TESTING W REFLEX): HIV Screen 4th Generation wRfx: NONREACTIVE

## 2022-08-20 LAB — RPR: RPR Ser Ql: NONREACTIVE

## 2022-08-22 LAB — HSV CULTURE AND TYPING

## 2022-08-25 ENCOUNTER — Telehealth (HOSPITAL_COMMUNITY): Payer: Self-pay | Admitting: Emergency Medicine

## 2022-08-25 MED ORDER — VALACYCLOVIR HCL 1 G PO TABS: 1000.0000 mg | ORAL_TABLET | Freq: Three times a day (TID) | ORAL | 0 refills | Status: DC

## 2022-08-25 MED ORDER — METRONIDAZOLE 500 MG PO TABS: 500.0000 mg | ORAL_TABLET | Freq: Two times a day (BID) | ORAL | 0 refills | Status: DC

## 2022-12-16 ENCOUNTER — Ambulatory Visit
Admission: RE | Admit: 2022-12-16 | Discharge: 2022-12-16 | Disposition: A | Discharge status: Home / Self Care | Payer: Medicare PPO | Source: Ambulatory Visit | Attending: Family Medicine | Admitting: Family Medicine

## 2022-12-16 DIAGNOSIS — Z1231 Encounter for screening mammogram for malignant neoplasm of breast: Secondary | ICD-10-CM | POA: Diagnosis not present

## 2022-12-16 DIAGNOSIS — Z780 Asymptomatic menopausal state: Secondary | ICD-10-CM | POA: Diagnosis not present

## 2022-12-16 DIAGNOSIS — M8589 Other specified disorders of bone density and structure, multiple sites: Secondary | ICD-10-CM | POA: Diagnosis not present

## 2022-12-16 DIAGNOSIS — Z13820 Encounter for screening for osteoporosis: Secondary | ICD-10-CM

## 2023-03-13 DIAGNOSIS — H40023 Open angle with borderline findings, high risk, bilateral: Secondary | ICD-10-CM | POA: Diagnosis not present

## 2023-03-13 DIAGNOSIS — H353121 Nonexudative age-related macular degeneration, left eye, early dry stage: Secondary | ICD-10-CM | POA: Diagnosis not present

## 2023-03-13 DIAGNOSIS — Z961 Presence of intraocular lens: Secondary | ICD-10-CM | POA: Diagnosis not present

## 2023-03-13 DIAGNOSIS — H16102 Unspecified superficial keratitis, left eye: Secondary | ICD-10-CM | POA: Diagnosis not present

## 2023-03-16 DIAGNOSIS — H40023 Open angle with borderline findings, high risk, bilateral: Secondary | ICD-10-CM | POA: Diagnosis not present

## 2023-03-16 DIAGNOSIS — H353121 Nonexudative age-related macular degeneration, left eye, early dry stage: Secondary | ICD-10-CM | POA: Diagnosis not present

## 2023-03-17 DIAGNOSIS — H353121 Nonexudative age-related macular degeneration, left eye, early dry stage: Secondary | ICD-10-CM | POA: Diagnosis not present

## 2023-03-17 DIAGNOSIS — H40023 Open angle with borderline findings, high risk, bilateral: Secondary | ICD-10-CM | POA: Diagnosis not present

## 2023-03-18 DIAGNOSIS — H04123 Dry eye syndrome of bilateral lacrimal glands: Secondary | ICD-10-CM | POA: Diagnosis not present

## 2023-03-18 DIAGNOSIS — H0288A Meibomian gland dysfunction right eye, upper and lower eyelids: Secondary | ICD-10-CM | POA: Diagnosis not present

## 2023-03-18 DIAGNOSIS — H0288B Meibomian gland dysfunction left eye, upper and lower eyelids: Secondary | ICD-10-CM | POA: Diagnosis not present

## 2023-07-02 ENCOUNTER — Other Ambulatory Visit (HOSPITAL_COMMUNITY): Payer: Self-pay | Admitting: Family Medicine

## 2023-07-02 DIAGNOSIS — Z1211 Encounter for screening for malignant neoplasm of colon: Secondary | ICD-10-CM | POA: Diagnosis not present

## 2023-07-02 DIAGNOSIS — E7800 Pure hypercholesterolemia, unspecified: Secondary | ICD-10-CM | POA: Diagnosis not present

## 2023-07-02 DIAGNOSIS — Z1239 Encounter for other screening for malignant neoplasm of breast: Secondary | ICD-10-CM | POA: Diagnosis not present

## 2023-07-02 DIAGNOSIS — E669 Obesity, unspecified: Secondary | ICD-10-CM | POA: Diagnosis not present

## 2023-07-02 DIAGNOSIS — F900 Attention-deficit hyperactivity disorder, predominantly inattentive type: Secondary | ICD-10-CM | POA: Diagnosis not present

## 2023-07-02 DIAGNOSIS — M8588 Other specified disorders of bone density and structure, other site: Secondary | ICD-10-CM | POA: Diagnosis not present

## 2023-07-02 DIAGNOSIS — Z0000 Encounter for general adult medical examination without abnormal findings: Secondary | ICD-10-CM | POA: Diagnosis not present

## 2023-07-02 DIAGNOSIS — Z720 Tobacco use: Secondary | ICD-10-CM | POA: Diagnosis not present

## 2023-07-02 DIAGNOSIS — Z23 Encounter for immunization: Secondary | ICD-10-CM | POA: Diagnosis not present

## 2023-07-03 ENCOUNTER — Encounter: Payer: Self-pay | Admitting: Acute Care

## 2023-07-16 ENCOUNTER — Ambulatory Visit (HOSPITAL_COMMUNITY)
Admission: RE | Admit: 2023-07-16 | Discharge: 2023-07-16 | Disposition: A | Discharge status: Home / Self Care | Payer: Medicare PPO | Source: Ambulatory Visit | Attending: Family Medicine | Admitting: Family Medicine

## 2023-07-16 DIAGNOSIS — E7800 Pure hypercholesterolemia, unspecified: Secondary | ICD-10-CM | POA: Insufficient documentation

## 2023-10-22 DIAGNOSIS — R0789 Other chest pain: Secondary | ICD-10-CM | POA: Diagnosis not present

## 2023-10-22 DIAGNOSIS — E7800 Pure hypercholesterolemia, unspecified: Secondary | ICD-10-CM | POA: Diagnosis not present

## 2023-10-22 DIAGNOSIS — I77819 Aortic ectasia, unspecified site: Secondary | ICD-10-CM | POA: Diagnosis not present

## 2023-10-22 DIAGNOSIS — I700 Atherosclerosis of aorta: Secondary | ICD-10-CM | POA: Diagnosis not present

## 2023-10-22 DIAGNOSIS — E559 Vitamin D deficiency, unspecified: Secondary | ICD-10-CM | POA: Diagnosis not present

## 2024-01-11 ENCOUNTER — Ambulatory Visit (HOSPITAL_BASED_OUTPATIENT_CLINIC_OR_DEPARTMENT_OTHER): Payer: Medicare PPO | Admitting: Cardiology

## 2024-01-11 VITALS — BP 132/98 | HR 69 | Ht 66.0 in | Wt 211.0 lb

## 2024-01-11 DIAGNOSIS — E7800 Pure hypercholesterolemia, unspecified: Secondary | ICD-10-CM

## 2024-01-11 DIAGNOSIS — R072 Precordial pain: Secondary | ICD-10-CM | POA: Diagnosis not present

## 2024-01-11 DIAGNOSIS — R079 Chest pain, unspecified: Secondary | ICD-10-CM

## 2024-01-11 MED ORDER — METOPROLOL TARTRATE 50 MG PO TABS: 50.0000 mg | ORAL_TABLET | ORAL | 0 refills | Status: AC

## 2024-01-11 NOTE — Patient Instructions (Signed)
 Medication Instructions:  The current medical regimen is effective;  continue present plan and medications.  *If you need a refill on your cardiac medications before your next appointment, please call your pharmacy*   Testing/Procedures:   Your cardiac CT will be scheduled at:   [facility] [address] [place], [place] [address] [phone]   Please arrive at the [facility] and Children's Entrance (Entrance C2) of [facility] 30 minutes prior to test start time. You can use the FREE valet parking offered at entrance C (encouraged to control the heart rate for the test)  Proceed to the [facility] Radiology Department (first floor) to check-in and test prep.  All radiology patients and guests should use entrance C2 at [facility], accessed from [facility], even though the hospital's physical address listed is [address]      Please follow these instructions carefully (unless otherwise directed):  An IV will be required for this test and Nitroglycerin will be given.  Hold all erectile dysfunction medications at least 3 days (72 hrs) prior to test. (Ie viagra, cialis, sildenafil, tadalafil, etc)   On the Night Before the Test: Be sure to Drink plenty of water. Do not consume any caffeinated/decaffeinated beverages or chocolate 12 hours prior to your test. Do not take any antihistamines 12 hours prior to your test.  On the Day of the Test: Drink plenty of water until 1 hour prior to the test. Do not eat any food 1 hour prior to test. You may take your regular medications prior to the test.  Take metoprolol (Lopressor) two hours prior to test. If you take Furosemide/Hydrochlorothiazide/Spironolactone/Chlorthalidone, please HOLD on the morning of the test. Patients who wear a continuous glucose monitor MUST remove the device prior to scanning. FEMALES- please wear underwire-free bra if available, avoid dresses & tight  clothing  After the Test: Drink plenty of water. After receiving IV contrast, you may experience a mild flushed feeling. This is normal. On occasion, you may experience a mild rash up to 24 hours after the test. This is not dangerous. If this occurs, you can take Benadryl 25 mg, Zyrtec, Claritin, or Allegra and increase your fluid intake. (Patients taking Tikosyn should avoid Benadryl, and may take Zyrtec, Claritin, or Allegra) If you experience trouble breathing, this can be serious. If it is severe call 911 IMMEDIATELY. If it is mild, please call our office.  We will call to schedule your test 2-4 weeks out understanding that some insurance companies will need an authorization prior to the service being performed.   For more information and frequently asked questions, please visit our website : [email or web address]  For non-scheduling related questions, please contact the cardiac imaging nurse navigator should you have any questions/concerns: Cardiac Imaging Nurse Navigators Direct Office Dial: [phone]   For scheduling needs, including cancellations and rescheduling, please call [place], [phone].   Follow-Up: At [facility], you and your health needs are our priority.  As part of our continuing mission to provide you with exceptional heart care, our providers are all part of one team.  This team includes your primary Cardiologist (physician) and Advanced Practice Providers or APPs (Physician Assistants and Nurse Practitioners) who all work together to provide you with the care you need, when you need it.  Your next appointment:   Follow up will be  based on  the results of the above testing.   We recommend signing up for the patient portal called "  MyChart".  Sign up information is provided on this After Visit Summary.  MyChart is used to connect with patients for Virtual Visits (Telemedicine).  Patients are able to view lab/test results, encounter notes,  upcoming appointments, etc.  Non-urgent messages can be sent to your provider as well.   To learn more about what you can do with MyChart, go to [email or web address].

## 2024-01-11 NOTE — Progress Notes (Signed)
 Cardiology Office Note:  .   Date:  01/11/2024  ID:  Lauren Miller, DOB Sep 15, 1956, MRN [id number] PCP: Maurice Small, MD (Inactive)  [facility] Providers Cardiologist:  None    History of Present Illness: .   Lauren Miller is a 68 y.o. female Discussed the use of AI scribe software for clinical note transcription with the patient, who gave verbal consent to proceed.  History of Present Illness Lauren Miller is a 68 year old female who presents with chest pain.  She experiences chest pain that initially occurred last summer while gardening on a hot day. The pain radiated to her back and was accompanied by shortness of breath, resolving within 5 to 10 minutes with rest and deep breathing. Since then, she has had similar episodes approximately six times, lasting from one to 20 minutes. The pain is infrequent and does not occur during high-intensity interval training or weightlifting sessions at the gym, where her heart rate reaches 130 to 140 bpm.  She engages in regular high-intensity interval training and weightlifting at the gym, activities she resumed post-COVID and participated in prior to the pandemic. Despite the chest pain episodes, she does not experience symptoms during these exercises.  She is an ex-smoker, having quit smoking cigarettes at the start of the 2020s after smoking since age 89. She also stopped smoking marijuana last year and has been vaping occasionally.  Her LDL cholesterol was noted to be 179 mg/dL in September of the previous year. She is not currently on any medications for heart or blood pressure issues.  Her father had heart disease and high blood pressure, and he passed away due to heart-related issues.      ROS: No syncope  Studies Reviewed: Marland Kitchen   EKG Interpretation Date/Time:  Monday January 11 2024 14:37:01 EDT Ventricular Rate:  66 PR Interval:  172 QRS Duration:  90 QT Interval:  398 QTC Calculation: 417 R Axis:   21  Text  Interpretation: Normal sinus rhythm with sinus arrhythmia Nonspecific ST abnormality No previous ECGs available Confirmed by Donato Schultz ([id number]) on 01/11/2024 2:46:35 PM    Results LABS LDL: 179 (06/2023)  RADIOLOGY Coronary calcium score: 0 CT scan: Dilated ascending aorta measuring 41 mm Risk Assessment/Calculations:           Physical Exam:   VS:  BP (!) 132/98   Pulse 69   Ht 5\' 6"  (1.676 m)   Wt 211 lb (95.7 kg)   SpO2 96%   BMI 34.06 kg/m    Wt Readings from Last 3 Encounters:  01/11/24 211 lb (95.7 kg)  01/13/13 192 lb (87.1 kg)    GEN: Well nourished, well developed in no acute distress NECK: No JVD; No carotid bruits CARDIAC: RRR, no murmurs, no rubs, no gallops RESPIRATORY:  Clear to auscultation without rales, wheezing or rhonchi  ABDOMEN: Soft, non-tender, non-distended EXTREMITIES:  No edema; No deformity   ASSESSMENT AND PLAN: .    Assessment and Plan Assessment & Plan Chest Pain Intermittent chest pain over the past year, primarily during stress or physical exertion like gardening, not associated with gym exercise, suggesting a non-cardiac origin. Coronary calcium score is zero, indicating low coronary artery disease risk, but soft plaque presence cannot be excluded without further testing. A coronary CT angiogram with contrast is recommended to evaluate for soft plaque or arterial narrowing. - Order coronary CT angiogram with contrast to evaluate for soft plaque or arterial narrowing. - Continue smoking cessation efforts. - Monitor blood pressure  and cholesterol levels.  Dilated Ascending Aorta Dilated ascending aorta measuring 41 mm on previous imaging. No acute symptoms reported. Regular monitoring is necessary to assess changes in size. - Monitor the size of the ascending aorta with regular imaging as per standard guidelines. Could repeat CT in 2 years given mild severity. Let's see what contrasted study shows. This is a more accurate reading.    Hyperlipidemia LDL cholesterol level was 161 mg/dL as of last September, elevated and may require medical management depending on coronary CT angiogram results. The decision to initiate lipid-lowering medication will be based on CT angiogram findings and overall cardiovascular risk assessment. - Evaluate the need for lipid-lowering medication based on the results of the coronary CT angiogram.  Family History of Heart Disease Family history of heart disease, with her father having had heart disease and hypertension, contributing to her overall cardiovascular risk profile. - Consider family history in the overall cardiovascular risk assessment.         Dispo: Will follow up with results of study  Signed, Donato Schultz, MD

## 2024-01-12 ENCOUNTER — Other Ambulatory Visit (HOSPITAL_BASED_OUTPATIENT_CLINIC_OR_DEPARTMENT_OTHER): Payer: Self-pay | Admitting: Cardiology

## 2024-01-22 ENCOUNTER — Encounter (HOSPITAL_COMMUNITY): Payer: Self-pay

## 2024-01-26 ENCOUNTER — Ambulatory Visit (HOSPITAL_COMMUNITY)
Admission: RE | Admit: 2024-01-26 | Discharge: 2024-01-26 | Disposition: A | Discharge status: Home / Self Care | Source: Ambulatory Visit | Attending: Cardiology | Admitting: Cardiology

## 2024-01-26 DIAGNOSIS — R072 Precordial pain: Secondary | ICD-10-CM | POA: Insufficient documentation

## 2024-01-26 MED ORDER — NITROGLYCERIN 0.4 MG SL SUBL
SUBLINGUAL_TABLET | SUBLINGUAL | Status: AC
  Filled 2024-01-26: qty 2

## 2024-01-26 MED ORDER — NITROGLYCERIN 0.4 MG SL SUBL
0.8000 mg | SUBLINGUAL_TABLET | Freq: Once | SUBLINGUAL | Status: AC
  Administered 2024-01-26: 0.8 mg via SUBLINGUAL

## 2024-01-26 MED ORDER — IOHEXOL 350 MG/ML SOLN
100.0000 mL | Freq: Once | INTRAVENOUS | Status: AC | PRN
  Administered 2024-01-26: 100 mL via INTRAVENOUS

## 2024-01-26 MED ORDER — METOPROLOL TARTRATE 5 MG/5ML IV SOLN: 10.0000 mg | Freq: Once | INTRAVENOUS | Status: DC | PRN

## 2024-01-26 MED ORDER — DILTIAZEM HCL 25 MG/5ML IV SOLN: 10.0000 mg | INTRAVENOUS | Status: DC | PRN

## 2024-01-28 ENCOUNTER — Encounter (HOSPITAL_BASED_OUTPATIENT_CLINIC_OR_DEPARTMENT_OTHER): Payer: Self-pay | Admitting: Cardiology

## 2024-02-02 ENCOUNTER — Other Ambulatory Visit: Payer: Self-pay | Admitting: *Deleted

## 2024-02-02 ENCOUNTER — Telehealth: Payer: Self-pay | Admitting: Cardiology

## 2024-02-02 DIAGNOSIS — E7800 Pure hypercholesterolemia, unspecified: Secondary | ICD-10-CM

## 2024-02-02 DIAGNOSIS — Z79899 Other long term (current) drug therapy: Secondary | ICD-10-CM

## 2024-02-02 MED ORDER — ROSUVASTATIN CALCIUM 10 MG PO TABS: 10.0000 mg | ORAL_TABLET | Freq: Every day | ORAL | 6 refills | Status: DC

## 2024-02-02 NOTE — Telephone Encounter (Signed)
 Pt is calling requesting a refill on medication rosuvastatin , medication that is not listed on pt's medication list, but pt was contact concerning this medication. Please address

## 2024-02-02 NOTE — Telephone Encounter (Signed)
*  STAT* If patient is at the pharmacy, call can be transferred to refill team.   1. Which medications need to be refilled? (please list name of each medication and dose if known) new prescription for Rosuvastatin    2. Would you like to learn more about the convenience, safety, & potential cost savings by using the [facility]?    3. Are you open to using the [facility] (Type [facility].   4. Which pharmacy/location (including street and city if local pharmacy) is medication to be sent to?   [facility] [address] [place]   5. Do they need a 30 day or 90 day supply? 30 days and refills

## 2024-02-02 NOTE — Telephone Encounter (Signed)
 Spoke with patient and review results.  Agreeable to starting crestor  10 and having lipid in 2 months.  Aware to report to her closest [facility] for blood draw.  She will call back if any questions or concerns.

## 2024-02-02 NOTE — Telephone Encounter (Signed)
  Hugh Madura, MD 01/28/2024  8:09 AM EDT     Coronary calcium  score was 0 however there was soft plaque present on CT scan, mild.  Therefore there is minimal nonobstructive coronary disease present with proximal circumflex soft plaque.  The aorta was normal in size at 37 mm.  Previously measured mildly dilated.   Based upon these results, I would recommend using cholesterol-lowering medications to move your LDL to less than 70 for prevention.  My suggestion would be to use rosuvastatin  10 mg once a day.  Repeat lipid panel in 2 months.  This medication can be good at stabilizing plaque.  Of course continue with Mediterranean diet and exercise.   Pt above information is from coronary CT result - message was left for pt to contact the office to discuss starting Crestor  10 mg daily and scheduling f/u lipid in 2 mns.

## 2024-06-07 DIAGNOSIS — E7800 Pure hypercholesterolemia, unspecified: Secondary | ICD-10-CM | POA: Diagnosis not present

## 2024-06-07 DIAGNOSIS — Z79899 Other long term (current) drug therapy: Secondary | ICD-10-CM | POA: Diagnosis not present

## 2024-06-08 LAB — LIPID PANEL
Chol/HDL Ratio: 3.2 ratio (ref 0.0–4.4)
Cholesterol, Total: 185 mg/dL (ref 100–199)
HDL: 58 mg/dL (ref >39)
LDL Chol Calc (NIH): 105 mg/dL — ABNORMAL HIGH (ref 0–99)
Triglycerides: 122 mg/dL (ref 0–149)
VLDL Cholesterol Cal: 22 mg/dL (ref 5–40)

## 2024-06-09 ENCOUNTER — Ambulatory Visit: Payer: Self-pay | Admitting: *Deleted

## 2024-06-09 DIAGNOSIS — E7800 Pure hypercholesterolemia, unspecified: Secondary | ICD-10-CM

## 2024-06-09 DIAGNOSIS — Z79899 Other long term (current) drug therapy: Secondary | ICD-10-CM

## 2024-06-14 ENCOUNTER — Other Ambulatory Visit: Payer: Self-pay | Admitting: *Deleted

## 2024-06-14 DIAGNOSIS — E7800 Pure hypercholesterolemia, unspecified: Secondary | ICD-10-CM

## 2024-06-14 DIAGNOSIS — Z79899 Other long term (current) drug therapy: Secondary | ICD-10-CM

## 2024-06-14 MED ORDER — ROSUVASTATIN CALCIUM 20 MG PO TABS: 20.0000 mg | ORAL_TABLET | Freq: Every day | ORAL | 3 refills | Status: AC

## 2024-07-05 DIAGNOSIS — Z23 Encounter for immunization: Secondary | ICD-10-CM | POA: Diagnosis not present

## 2024-07-05 DIAGNOSIS — Z0000 Encounter for general adult medical examination without abnormal findings: Secondary | ICD-10-CM | POA: Diagnosis not present

## 2024-07-05 DIAGNOSIS — E7800 Pure hypercholesterolemia, unspecified: Secondary | ICD-10-CM | POA: Diagnosis not present

## 2024-07-05 DIAGNOSIS — Z130 Encounter for screening for diseases of the blood and blood-forming organs and certain disorders involving the immune mechanism: Secondary | ICD-10-CM | POA: Diagnosis not present

## 2024-07-05 DIAGNOSIS — N760 Acute vaginitis: Secondary | ICD-10-CM | POA: Diagnosis not present

## 2024-07-05 DIAGNOSIS — E559 Vitamin D deficiency, unspecified: Secondary | ICD-10-CM | POA: Diagnosis not present

## 2024-07-05 DIAGNOSIS — I2510 Atherosclerotic heart disease of native coronary artery without angina pectoris: Secondary | ICD-10-CM | POA: Diagnosis not present

## 2024-07-05 DIAGNOSIS — F900 Attention-deficit hyperactivity disorder, predominantly inattentive type: Secondary | ICD-10-CM | POA: Diagnosis not present

## 2024-07-05 DIAGNOSIS — E669 Obesity, unspecified: Secondary | ICD-10-CM | POA: Diagnosis not present

## 2024-07-05 LAB — LAB REPORT - SCANNED
A1c: 6
EGFR: 71

## 2024-07-06 ENCOUNTER — Ambulatory Visit: Payer: Self-pay | Admitting: Cardiology

## 2024-09-12 DIAGNOSIS — H04123 Dry eye syndrome of bilateral lacrimal glands: Secondary | ICD-10-CM | POA: Diagnosis not present

## 2024-10-14 ENCOUNTER — Other Ambulatory Visit: Payer: Self-pay | Admitting: Cardiology
